# Patient Record
Sex: Male | Born: 1995 | Hispanic: Refuse to answer | Marital: Single
Health system: Midwestern US, Academic
[De-identification: ages and names within clinical notes are randomized; demographics above are authoritative.]

---

## 2017-10-27 ENCOUNTER — Encounter: Admit: 2017-10-27 | Discharge: 2017-10-28 | Payer: BC Managed Care – PPO

## 2017-11-01 ENCOUNTER — Encounter: Admit: 2017-11-01 | Discharge: 2017-11-02 | Payer: BC Managed Care – PPO

## 2017-11-08 ENCOUNTER — Encounter: Admit: 2017-11-08 | Discharge: 2017-11-08 | Payer: BC Managed Care – PPO

## 2017-11-08 DIAGNOSIS — M25571 Pain in right ankle and joints of right foot: Principal | ICD-10-CM

## 2017-11-11 ENCOUNTER — Ambulatory Visit: Admit: 2017-11-11 | Discharge: 2017-11-11 | Payer: BC Managed Care – PPO

## 2017-11-11 ENCOUNTER — Encounter: Admit: 2017-11-11 | Discharge: 2017-11-11 | Payer: BC Managed Care – PPO

## 2017-11-11 DIAGNOSIS — S99911D Unspecified injury of right ankle, subsequent encounter: Principal | ICD-10-CM

## 2017-11-11 DIAGNOSIS — S82391D Other fracture of lower end of right tibia, subsequent encounter for closed fracture with routine healing: ICD-10-CM

## 2017-11-11 DIAGNOSIS — J45909 Unspecified asthma, uncomplicated: Principal | ICD-10-CM

## 2017-11-11 DIAGNOSIS — M25571 Pain in right ankle and joints of right foot: ICD-10-CM

## 2017-12-09 ENCOUNTER — Ambulatory Visit: Admit: 2017-12-09 | Discharge: 2017-12-09 | Payer: BC Managed Care – PPO

## 2017-12-09 DIAGNOSIS — S82391D Other fracture of lower end of right tibia, subsequent encounter for closed fracture with routine healing: ICD-10-CM

## 2017-12-09 DIAGNOSIS — S93421D Sprain of deltoid ligament of right ankle, subsequent encounter: ICD-10-CM

## 2017-12-09 DIAGNOSIS — S99911D Unspecified injury of right ankle, subsequent encounter: Principal | ICD-10-CM

## 2017-12-09 DIAGNOSIS — S93431D Sprain of tibiofibular ligament of right ankle, subsequent encounter: ICD-10-CM

## 2017-12-11 ENCOUNTER — Encounter: Admit: 2017-12-11 | Discharge: 2017-12-11 | Payer: BC Managed Care – PPO

## 2017-12-11 ENCOUNTER — Encounter: Admit: 2017-12-11 | Discharge: 2017-12-12 | Payer: BC Managed Care – PPO

## 2017-12-11 DIAGNOSIS — S93421D Sprain of deltoid ligament of right ankle, subsequent encounter: ICD-10-CM

## 2017-12-11 DIAGNOSIS — S99911D Unspecified injury of right ankle, subsequent encounter: Principal | ICD-10-CM

## 2017-12-11 DIAGNOSIS — S93491D Sprain of other ligament of right ankle, subsequent encounter: ICD-10-CM

## 2017-12-11 DIAGNOSIS — S82391D Other fracture of lower end of right tibia, subsequent encounter for closed fracture with routine healing: ICD-10-CM

## 2017-12-13 ENCOUNTER — Encounter: Admit: 2017-12-13 | Discharge: 2017-12-13 | Payer: BC Managed Care – PPO

## 2017-12-17 ENCOUNTER — Encounter: Admit: 2017-12-17 | Discharge: 2017-12-17 | Payer: BC Managed Care – PPO

## 2018-01-08 ENCOUNTER — Encounter: Admit: 2018-01-08 | Discharge: 2018-01-08 | Payer: BC Managed Care – PPO

## 2018-01-08 ENCOUNTER — Ambulatory Visit: Admit: 2018-01-08 | Discharge: 2018-01-09 | Payer: BC Managed Care – PPO

## 2018-01-08 DIAGNOSIS — J45909 Unspecified asthma, uncomplicated: Principal | ICD-10-CM

## 2018-01-09 DIAGNOSIS — S93421D Sprain of deltoid ligament of right ankle, subsequent encounter: Principal | ICD-10-CM

## 2018-01-09 DIAGNOSIS — S82391D Other fracture of lower end of right tibia, subsequent encounter for closed fracture with routine healing: ICD-10-CM

## 2018-01-09 DIAGNOSIS — S93431D Sprain of tibiofibular ligament of right ankle, subsequent encounter: ICD-10-CM

## 2018-01-22 ENCOUNTER — Encounter: Admit: 2018-01-22 | Discharge: 2018-01-23 | Payer: BC Managed Care – PPO

## 2018-01-27 ENCOUNTER — Encounter: Admit: 2018-01-27 | Discharge: 2018-01-27 | Payer: BC Managed Care – PPO

## 2018-02-12 ENCOUNTER — Encounter: Admit: 2018-02-12 | Discharge: 2018-02-12 | Payer: BC Managed Care – PPO

## 2018-02-12 ENCOUNTER — Ambulatory Visit: Admit: 2018-02-12 | Discharge: 2018-02-12 | Payer: BC Managed Care – PPO

## 2018-02-12 DIAGNOSIS — S93421D Sprain of deltoid ligament of right ankle, subsequent encounter: Principal | ICD-10-CM

## 2018-02-12 DIAGNOSIS — S93431D Sprain of tibiofibular ligament of right ankle, subsequent encounter: ICD-10-CM

## 2018-02-12 DIAGNOSIS — S82391D Other fracture of lower end of right tibia, subsequent encounter for closed fracture with routine healing: ICD-10-CM

## 2018-02-12 DIAGNOSIS — S99911D Unspecified injury of right ankle, subsequent encounter: Secondary | ICD-10-CM

## 2018-02-12 DIAGNOSIS — J45909 Unspecified asthma, uncomplicated: Principal | ICD-10-CM

## 2018-04-08 ENCOUNTER — Encounter: Admit: 2018-04-08 | Discharge: 2018-04-08 | Payer: BC Managed Care – PPO

## 2018-04-08 ENCOUNTER — Ambulatory Visit: Admit: 2018-04-08 | Discharge: 2018-04-09 | Payer: BC Managed Care – PPO

## 2018-04-08 DIAGNOSIS — J45909 Unspecified asthma, uncomplicated: Principal | ICD-10-CM

## 2018-04-08 DIAGNOSIS — M25571 Pain in right ankle and joints of right foot: Secondary | ICD-10-CM

## 2018-04-08 DIAGNOSIS — S93421D Sprain of deltoid ligament of right ankle, subsequent encounter: Principal | ICD-10-CM

## 2018-04-09 DIAGNOSIS — S93431D Sprain of tibiofibular ligament of right ankle, subsequent encounter: ICD-10-CM

## 2018-04-21 ENCOUNTER — Ambulatory Visit: Admit: 2018-04-21 | Discharge: 2018-04-21 | Payer: BC Managed Care – PPO

## 2018-04-21 DIAGNOSIS — S93421D Sprain of deltoid ligament of right ankle, subsequent encounter: Principal | ICD-10-CM

## 2018-04-21 DIAGNOSIS — S93491D Sprain of other ligament of right ankle, subsequent encounter: ICD-10-CM

## 2018-04-21 DIAGNOSIS — M25571 Pain in right ankle and joints of right foot: ICD-10-CM

## 2018-04-21 DIAGNOSIS — S93431D Sprain of tibiofibular ligament of right ankle, subsequent encounter: ICD-10-CM

## 2018-04-22 ENCOUNTER — Encounter: Admit: 2018-04-22 | Discharge: 2018-04-22 | Payer: BC Managed Care – PPO

## 2018-04-22 ENCOUNTER — Ambulatory Visit: Admit: 2018-04-22 | Discharge: 2018-04-23 | Payer: BC Managed Care – PPO

## 2018-04-22 DIAGNOSIS — S93491D Sprain of other ligament of right ankle, subsequent encounter: Principal | ICD-10-CM

## 2018-04-22 DIAGNOSIS — J45909 Unspecified asthma, uncomplicated: Principal | ICD-10-CM

## 2018-04-22 DIAGNOSIS — S93421D Sprain of deltoid ligament of right ankle, subsequent encounter: Principal | ICD-10-CM

## 2018-04-23 ENCOUNTER — Encounter: Admit: 2018-04-23 | Discharge: 2018-04-23 | Payer: BC Managed Care – PPO

## 2018-04-23 DIAGNOSIS — S93491D Sprain of other ligament of right ankle, subsequent encounter: Principal | ICD-10-CM

## 2018-04-23 DIAGNOSIS — M25371 Other instability, right ankle: ICD-10-CM

## 2018-04-23 MED ORDER — CEFAZOLIN 1 GRAM IJ SOLR
2 g | Freq: Once | INTRAVENOUS | 0 refills | Status: CN
Start: 2018-04-23 — End: ?

## 2018-04-30 ENCOUNTER — Encounter: Admit: 2018-04-30 | Discharge: 2018-04-30 | Payer: BC Managed Care – PPO

## 2018-04-30 DIAGNOSIS — J45909 Unspecified asthma, uncomplicated: Principal | ICD-10-CM

## 2018-05-08 ENCOUNTER — Encounter: Admit: 2018-05-08 | Discharge: 2018-05-08 | Payer: BC Managed Care – PPO

## 2018-05-12 ENCOUNTER — Encounter: Admit: 2018-05-12 | Discharge: 2018-05-12 | Payer: BC Managed Care – PPO

## 2018-05-12 DIAGNOSIS — J45909 Unspecified asthma, uncomplicated: Principal | ICD-10-CM

## 2018-05-14 ENCOUNTER — Encounter: Admit: 2018-05-14 | Discharge: 2018-05-14 | Payer: BC Managed Care – PPO

## 2018-05-14 ENCOUNTER — Ambulatory Visit: Admit: 2018-05-14 | Discharge: 2018-05-15 | Payer: BC Managed Care – PPO

## 2018-05-14 DIAGNOSIS — J45909 Unspecified asthma, uncomplicated: Principal | ICD-10-CM

## 2018-05-14 MED ORDER — ASPIRIN 81 MG PO TBEC
81 mg | ORAL_TABLET | Freq: Every day | ORAL | 0 refills | Status: AC
Start: 2018-05-14 — End: 2018-05-27

## 2018-05-14 MED ORDER — BACITRACIN ZINC 500 UNIT/GRAM TP OINT
0 refills | Status: DC
Start: 2018-05-14 — End: 2018-05-19

## 2018-05-14 MED ORDER — PROMETHAZINE 25 MG/ML IJ SOLN
6.25 mg | INTRAVENOUS | 0 refills | Status: DC | PRN
Start: 2018-05-14 — End: 2018-05-19

## 2018-05-14 MED ORDER — GABAPENTIN 300 MG PO CAP
300 mg | Freq: Once | ORAL | 0 refills | Status: CP
Start: 2018-05-14 — End: ?

## 2018-05-14 MED ORDER — MIDAZOLAM 1 MG/ML IJ SOLN
INTRAVENOUS | 0 refills | Status: CP
Start: 2018-05-14 — End: ?

## 2018-05-14 MED ORDER — LIDOCAINE (PF) 200 MG/10 ML (2 %) IJ SYRG
0 refills | Status: DC
Start: 2018-05-14 — End: 2018-05-14

## 2018-05-14 MED ORDER — DEXMEDETOMIDINE IN 0.9 % NACL 80 MCG/20 ML (4 MCG/ML) IV SOLN
0 refills | Status: DC
Start: 2018-05-14 — End: 2018-05-14

## 2018-05-14 MED ORDER — LACTATED RINGERS IV SOLP
1000 mL | INTRAVENOUS | 0 refills | Status: DC
Start: 2018-05-14 — End: 2018-05-19

## 2018-05-14 MED ORDER — FENTANYL CITRATE (PF) 50 MCG/ML IJ SOLN
50 ug | INTRAVENOUS | 0 refills | Status: DC | PRN
Start: 2018-05-14 — End: 2018-05-19

## 2018-05-14 MED ORDER — DOCUSATE SODIUM 100 MG PO CAP
100 mg | ORAL_CAPSULE | Freq: Two times a day (BID) | ORAL | 0 refills | Status: AC
Start: 2018-05-14 — End: 2018-05-27

## 2018-05-14 MED ORDER — OXYCODONE 5 MG PO TAB
5 mg | ORAL_TABLET | ORAL | 0 refills | 6.00000 days | Status: AC | PRN
Start: 2018-05-14 — End: 2018-05-27

## 2018-05-14 MED ORDER — FENTANYL CITRATE (PF) 50 MCG/ML IJ SOLN
0 refills | Status: DC
Start: 2018-05-14 — End: 2018-05-14

## 2018-05-14 MED ORDER — OXYCODONE 5 MG PO TAB
5-10 mg | Freq: Once | ORAL | 0 refills | Status: AC | PRN
Start: 2018-05-14 — End: ?

## 2018-05-14 MED ORDER — ONDANSETRON HCL 4 MG PO TAB
4 mg | ORAL_TABLET | ORAL | 0 refills | 8.00000 days | Status: AC | PRN
Start: 2018-05-14 — End: 2018-05-27

## 2018-05-14 MED ORDER — ONDANSETRON 4 MG PO TBDI
4 mg | Freq: Once | ORAL | 0 refills | Status: AC | PRN
Start: 2018-05-14 — End: ?

## 2018-05-14 MED ORDER — ACETAMINOPHEN 500 MG PO TAB
1000 mg | Freq: Once | ORAL | 0 refills | Status: CP
Start: 2018-05-14 — End: ?

## 2018-05-14 MED ORDER — DEXAMETHASONE SODIUM PHOSPHATE 4 MG/ML IJ SOLN
INTRAVENOUS | 0 refills | Status: DC
Start: 2018-05-14 — End: 2018-05-14

## 2018-05-14 MED ORDER — LIDOCAINE (PF) 10 MG/ML (1 %) IJ SOLN
SUBCUTANEOUS | 0 refills | Status: CP
Start: 2018-05-14 — End: ?

## 2018-05-14 MED ORDER — ONDANSETRON HCL (PF) 4 MG/2 ML IJ SOLN
INTRAVENOUS | 0 refills | Status: DC
Start: 2018-05-14 — End: 2018-05-14

## 2018-05-14 MED ORDER — MIDAZOLAM 1 MG/ML IJ SOLN
INTRAVENOUS | 0 refills | Status: DC
Start: 2018-05-14 — End: 2018-05-14

## 2018-05-14 MED ORDER — MEPERIDINE (PF) 25 MG/ML IJ SYRG
12.5 mg | INTRAVENOUS | 0 refills | Status: DC | PRN
Start: 2018-05-14 — End: 2018-05-19

## 2018-05-14 MED ORDER — FENTANYL CITRATE (PF) 50 MCG/ML IJ SOLN
25 ug | INTRAVENOUS | 0 refills | Status: DC | PRN
Start: 2018-05-14 — End: 2018-05-19

## 2018-05-14 MED ORDER — ROPIVACAINE (PF) 5 MG/ML (0.5 %) IJ SOLN
0 refills | Status: CP
Start: 2018-05-14 — End: ?

## 2018-05-14 MED ORDER — PROPOFOL INJ 10 MG/ML IV VIAL
0 refills | Status: DC
Start: 2018-05-14 — End: 2018-05-14

## 2018-05-14 MED ORDER — SODIUM CHLORIDE 0.9 % IV SOLP (OR) 500ML
0 refills | Status: DC
Start: 2018-05-14 — End: 2018-05-19

## 2018-05-14 MED ORDER — ONDANSETRON HCL (PF) 4 MG/2 ML IJ SOLN
4 mg | Freq: Once | INTRAVENOUS | 0 refills | Status: AC | PRN
Start: 2018-05-14 — End: ?

## 2018-05-14 MED ORDER — HALOPERIDOL LACTATE 5 MG/ML IJ SOLN
1 mg | Freq: Once | INTRAVENOUS | 0 refills | Status: AC | PRN
Start: 2018-05-14 — End: ?

## 2018-05-14 MED ORDER — CEFAZOLIN 1 GRAM IJ SOLR
2 g | Freq: Once | INTRAVENOUS | 0 refills | Status: CP
Start: 2018-05-14 — End: ?

## 2018-05-15 DIAGNOSIS — S93431D Sprain of tibiofibular ligament of right ankle, subsequent encounter: Principal | ICD-10-CM

## 2018-05-27 ENCOUNTER — Ambulatory Visit: Admit: 2018-05-27 | Discharge: 2018-05-28 | Payer: BC Managed Care – PPO

## 2018-05-27 ENCOUNTER — Encounter: Admit: 2018-05-27 | Discharge: 2018-05-27 | Payer: BC Managed Care – PPO

## 2018-05-27 DIAGNOSIS — S93491D Sprain of other ligament of right ankle, subsequent encounter: ICD-10-CM

## 2018-05-27 DIAGNOSIS — J45909 Unspecified asthma, uncomplicated: Principal | ICD-10-CM

## 2018-05-28 DIAGNOSIS — S93431D Sprain of tibiofibular ligament of right ankle, subsequent encounter: Principal | ICD-10-CM

## 2018-07-11 ENCOUNTER — Encounter: Admit: 2018-07-11 | Discharge: 2018-07-11 | Payer: BC Managed Care – PPO

## 2018-07-11 DIAGNOSIS — Z9889 Other specified postprocedural states: Secondary | ICD-10-CM

## 2018-07-15 ENCOUNTER — Encounter: Admit: 2018-07-15 | Discharge: 2018-07-15 | Payer: BC Managed Care – PPO

## 2018-07-15 ENCOUNTER — Ambulatory Visit: Admit: 2018-07-15 | Discharge: 2018-07-15 | Payer: BC Managed Care – PPO

## 2018-07-15 DIAGNOSIS — J45909 Unspecified asthma, uncomplicated: Secondary | ICD-10-CM

## 2018-07-15 DIAGNOSIS — Z9889 Other specified postprocedural states: Secondary | ICD-10-CM

## 2018-08-20 ENCOUNTER — Encounter: Admit: 2018-08-20 | Discharge: 2018-08-20 | Payer: BC Managed Care – PPO

## 2018-08-20 DIAGNOSIS — Z9889 Other specified postprocedural states: Principal | ICD-10-CM

## 2018-08-26 ENCOUNTER — Ambulatory Visit: Admit: 2018-08-26 | Discharge: 2018-08-26 | Payer: BC Managed Care – PPO

## 2018-08-26 ENCOUNTER — Encounter: Admit: 2018-08-26 | Discharge: 2018-08-26 | Payer: BC Managed Care – PPO

## 2018-08-26 DIAGNOSIS — Z9889 Other specified postprocedural states: Principal | ICD-10-CM

## 2018-08-26 DIAGNOSIS — J45909 Unspecified asthma, uncomplicated: Principal | ICD-10-CM

## 2018-08-26 NOTE — Progress Notes
SUBJECTIVE:  Steve Day presents to the office for a 3 month post-operative follow up on his right ankle arthroscopy with ORIF syndesmosis on 05/15/2019.  He is doing well with minimal complaints.  He is participating in PT.  He is wearing the velocity ankle brace with activity.  He has returned to running and is tolerating this well.      OBJECTIVE:  Right foot and ankle was evaluated. Wound well healed, Tender to palpation over lateral ankle.   Ankle ROM: dorsiflexion 25, plantar flexion 50, inversion 25, eversion 15, Negative anterior drawer, ankle MS 5/5, neurovascularly intact.        IMPRESSION:    ICD-9-CM ICD-10-CM    1. Syndesmotic ankle sprain, right, subsequent encounter V58.89 S93.431D     845.03     2. Closed fracture of posterior malleolus of right tibia with routine healing, subsequent encounter V54.19 S82.391D    3. Sprain of deltoid ligament of right ankle, subsequent encounter V58.89 S93.421D     845.01     4. Ankle instability, right 718.87 M25.371        2.  S/p Right ankle arthroscopy with ORIF syndesmosis    Plan:   D/c Velocity brace, use lace up ankle brace for 2 weeks  PT- continue per protocol  Ice and elevation prn  Avoid agility activity like BB and soccer    F/U 6 weeks.  No plain films needed.

## 2018-08-26 NOTE — Patient Instructions
D/c hinged ankle brace    Lace up ankle brace x 2 weeks, then discontinue all ankle braces.    Use ice daily on affected joint for 20 minutes preferably in the evening  Always cover skin with a towel and then apply ice    Continue PT    Avoid agility activities like basketball and soccer.    F/u in 6 weeks.    Please do not hesitate to contact my office with any questions.    Dr. Judie Grieve Vopat & Irwin Brakeman PA-C - Orthopedic Surgeon, Sports Medicine  [facility] - Phone [phone] - Fax [phone]   [address] - [address] [place], [place] [address]    Brunetta Jeans BSN, RN - Clinical Nurse Coordinator  Luiz Ochoa BSN, RN - Clinical Nurse Coordinator  Massie Maroon MS, ATC, LAT - Clinical Athletic Trainer    Thank you for supporting our practice! Your feedback helps [place] deliver the highest quality of care.  Review [place] at: [email or web address]

## 2018-10-17 IMAGING — CR LOW_EXM
3 series · 3 of 3 positions shown · non-contrast
Comparison: none

[tib-fib ap]
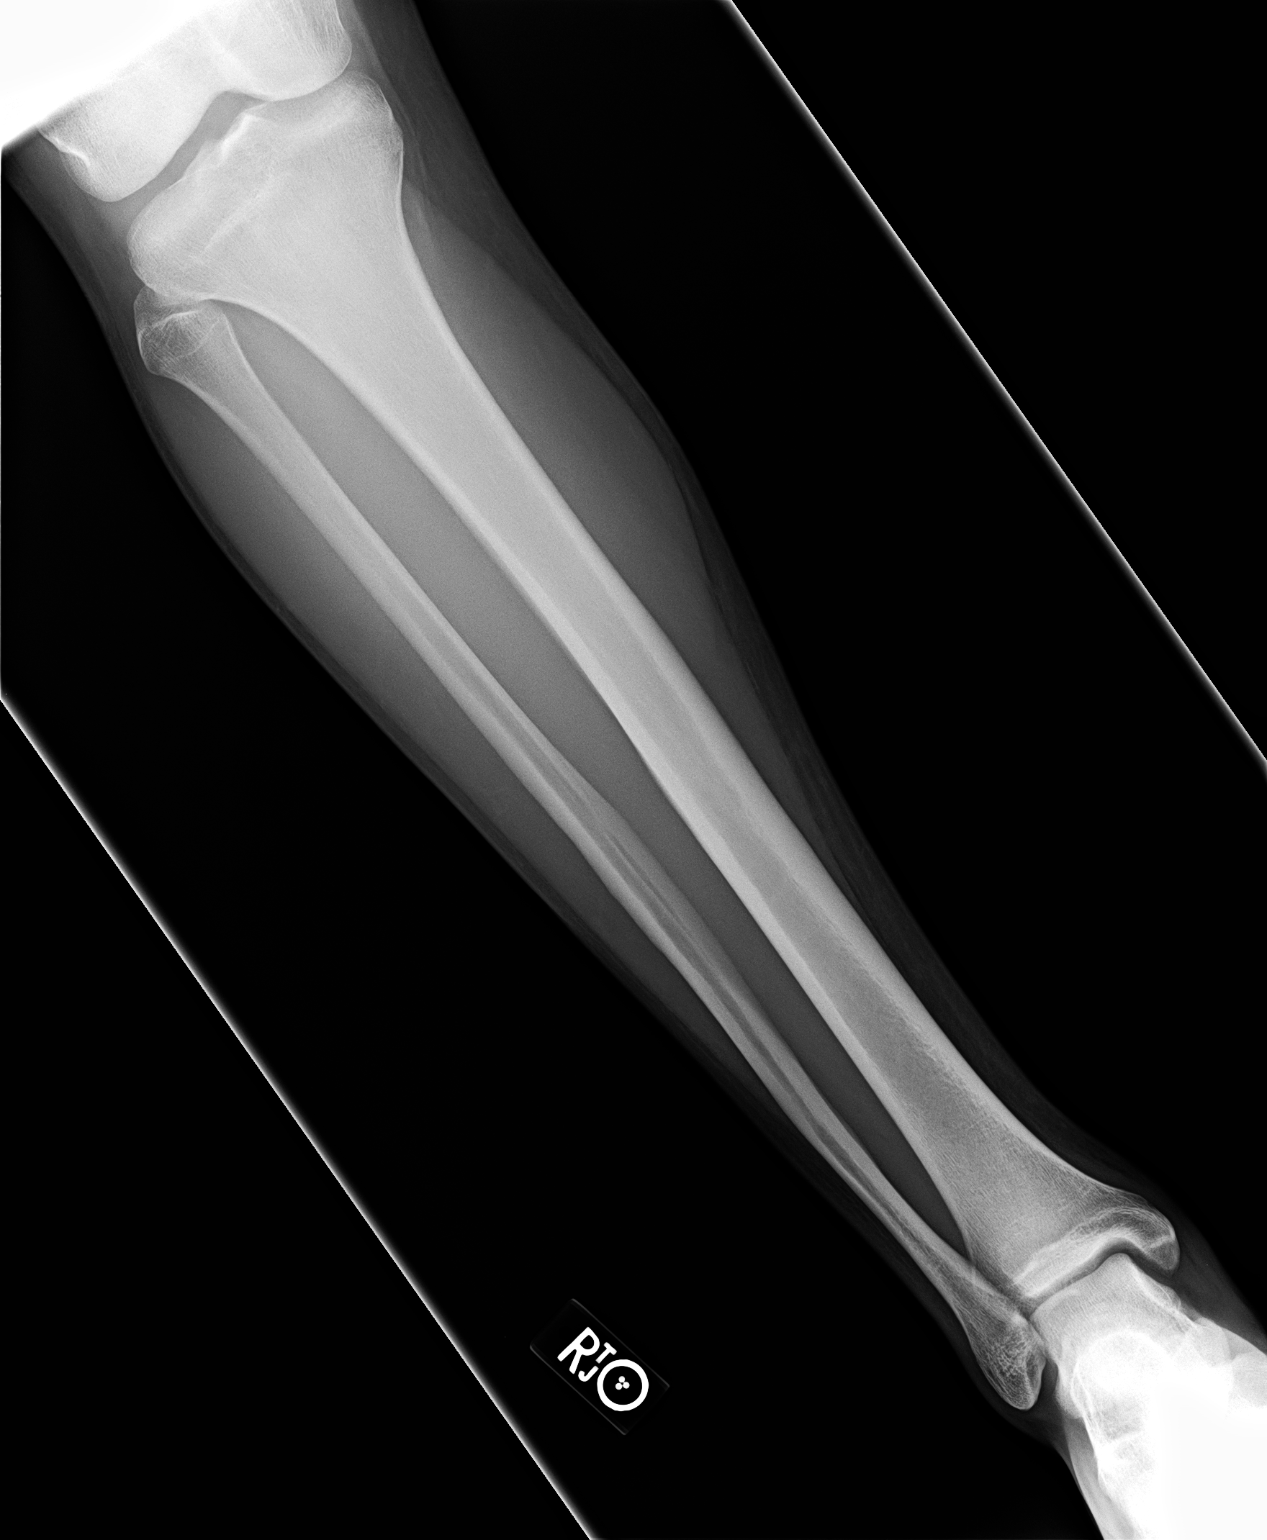

[tib-fib lat (1 of 2)]
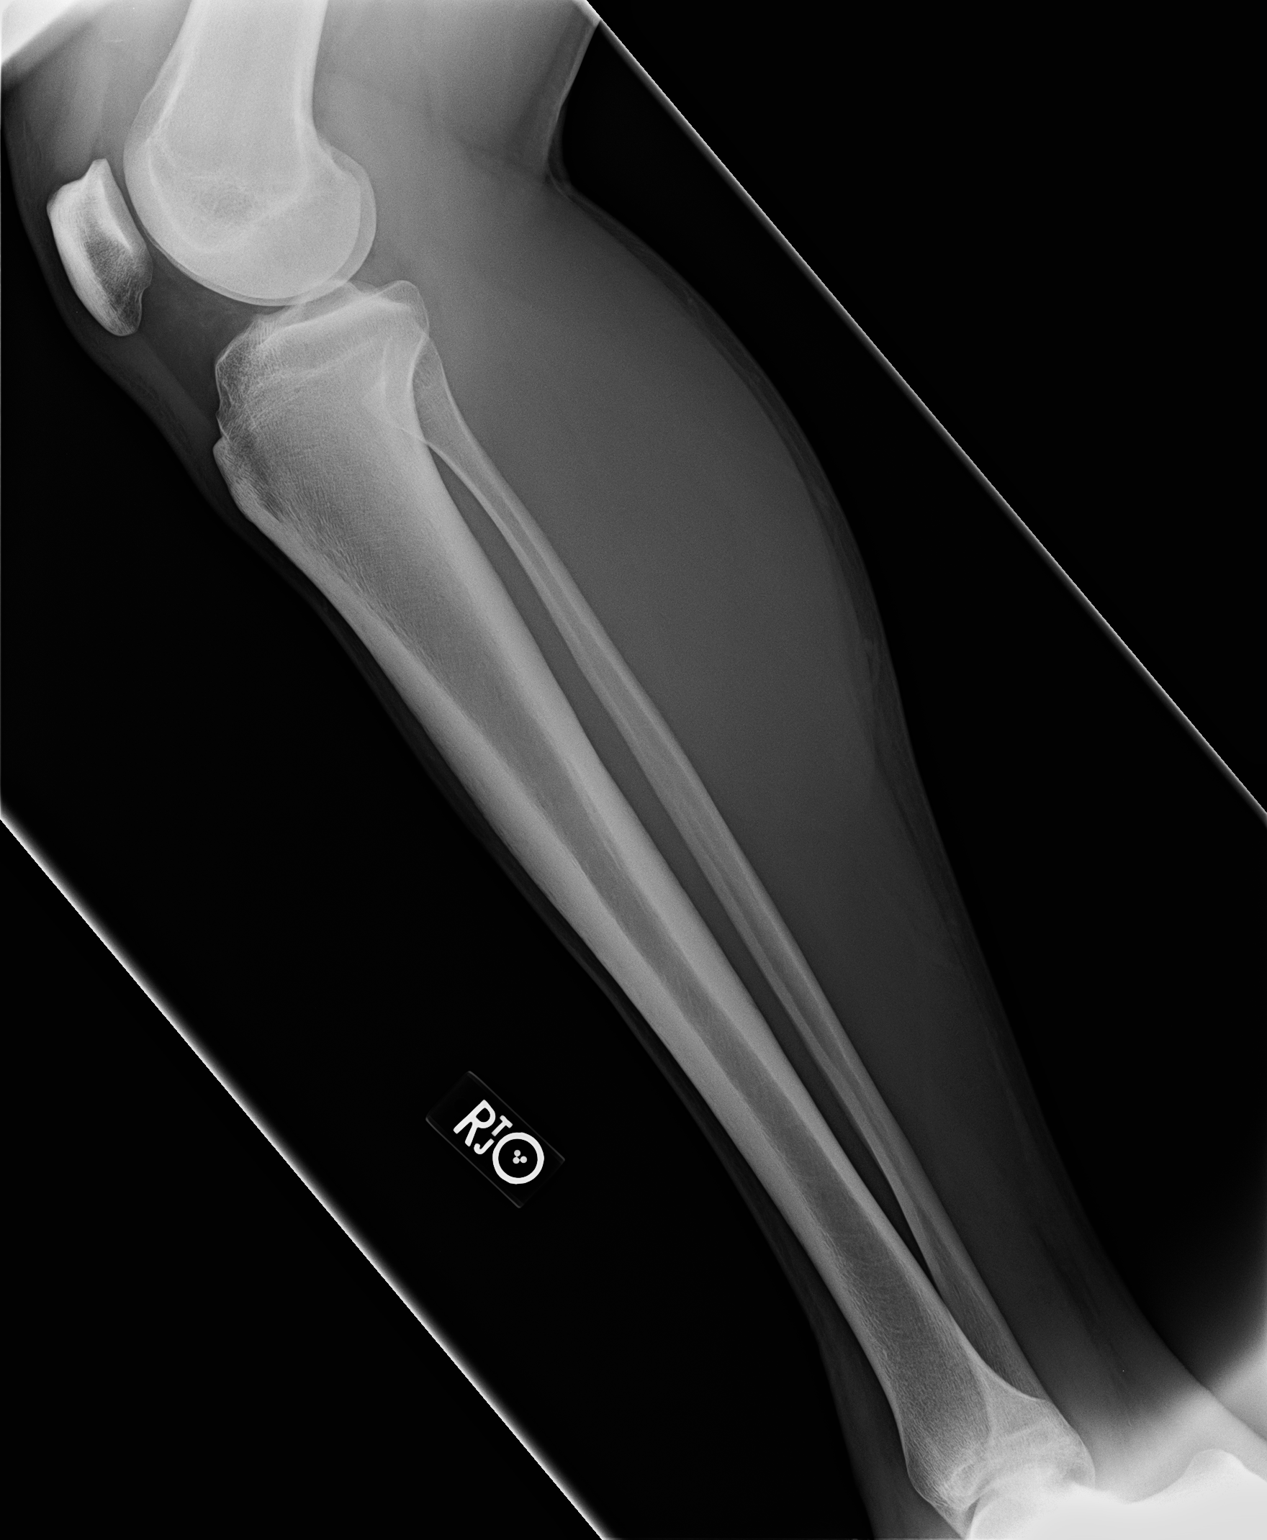

[tib-fib lat (2 of 2)]
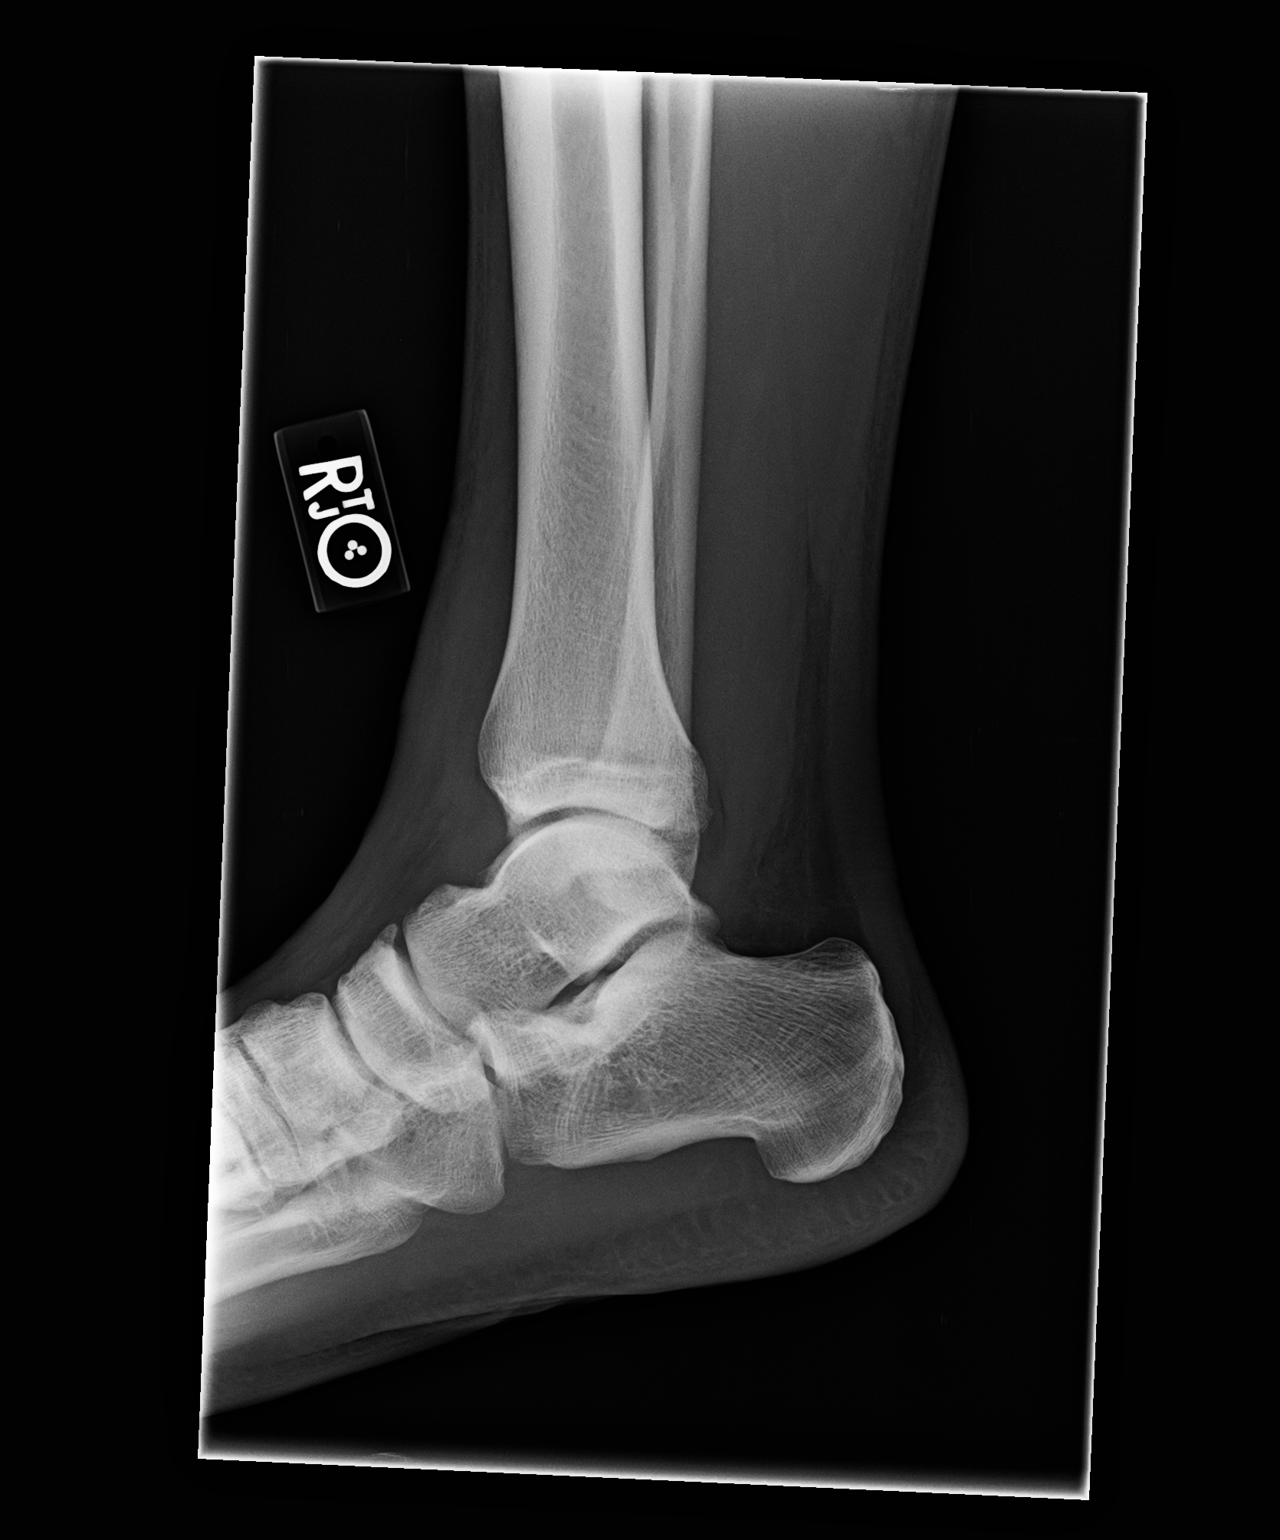

[3 of 3 positions shown; findings below may reference images not displayed]

12/11/17

DIAGNOSTIC STUDIES

EXAM
Three views tibia and fibula

INDICATION
fracture follow

TECHNIQUE
Three views right tibia and fibula

COMPARISONS
11/01/2017

FINDINGS
Bone density is normal. No fracture. Irregularity is again seen in the posterior malleolus without
evidence of fracture.

IMPRESSION
No acute fracture seen. Unchanged irregularity of the posterior malleolus

Tech Notes:

f/u fx. shielded.

## 2019-01-09 ENCOUNTER — Encounter: Admit: 2019-01-09 | Discharge: 2019-01-09
